# Patient Record
Sex: Male | Born: 1970 | Race: Black or African American | Hispanic: No | Marital: Married | Smoking: Current some day smoker
Health system: Southern US, Community
[De-identification: ages and names within clinical notes are randomized; demographics above are authoritative.]

## PROBLEM LIST (undated history)

## (undated) DIAGNOSIS — K859 Acute pancreatitis without necrosis or infection, unspecified: Secondary | ICD-10-CM

## (undated) DIAGNOSIS — I10 Essential (primary) hypertension: Secondary | ICD-10-CM

---

## 2001-07-31 ENCOUNTER — Emergency Department (HOSPITAL_COMMUNITY): Admission: EM | Admit: 2001-07-31 | Discharge: 2001-07-31 | Payer: Self-pay | Admitting: *Deleted

## 2010-02-23 ENCOUNTER — Emergency Department: Payer: Self-pay | Admitting: Emergency Medicine

## 2010-09-04 ENCOUNTER — Emergency Department: Payer: Self-pay | Admitting: Emergency Medicine

## 2010-09-08 ENCOUNTER — Emergency Department: Payer: Self-pay | Admitting: Emergency Medicine

## 2013-05-21 ENCOUNTER — Inpatient Hospital Stay: Payer: Self-pay | Admitting: Internal Medicine

## 2013-05-21 LAB — TROPONIN I: Troponin-I: 0.03 ng/mL

## 2013-05-21 LAB — CBC
MCV: 98 fL (ref 80–100)
RBC: 4.3 10*6/uL — ABNORMAL LOW (ref 4.40–5.90)
RDW: 14.4 % (ref 11.5–14.5)
WBC: 6.5 10*3/uL (ref 3.8–10.6)

## 2013-05-21 LAB — URINALYSIS, COMPLETE
Bacteria: NONE SEEN
Bilirubin,UR: NEGATIVE
Glucose,UR: NEGATIVE mg/dL (ref 0–75)
Ketone: NEGATIVE
Leukocyte Esterase: NEGATIVE
Protein: NEGATIVE
RBC,UR: 1 /HPF (ref 0–5)
Specific Gravity: 1.008 (ref 1.003–1.030)
WBC UR: 1 /HPF (ref 0–5)

## 2013-05-21 LAB — PRO B NATRIURETIC PEPTIDE: B-Type Natriuretic Peptide: 229 pg/mL — ABNORMAL HIGH (ref 0–125)

## 2013-05-21 LAB — COMPREHENSIVE METABOLIC PANEL
Albumin: 3.9 g/dL (ref 3.4–5.0)
Alkaline Phosphatase: 180 U/L — ABNORMAL HIGH (ref 50–136)
Bilirubin,Total: 0.9 mg/dL (ref 0.2–1.0)
Calcium, Total: 8.5 mg/dL (ref 8.5–10.1)
Co2: 37 mmol/L — ABNORMAL HIGH (ref 21–32)
Creatinine: 1.08 mg/dL (ref 0.60–1.30)
Glucose: 89 mg/dL (ref 65–99)
SGPT (ALT): 284 U/L — ABNORMAL HIGH (ref 12–78)
Sodium: 138 mmol/L (ref 136–145)

## 2013-05-21 LAB — TSH: Thyroid Stimulating Horm: 0.126 u[IU]/mL — ABNORMAL LOW

## 2013-05-21 LAB — MAGNESIUM
Magnesium: 1.3 mg/dL — ABNORMAL LOW
Magnesium: 1.2 mg/dL — ABNORMAL LOW

## 2013-05-22 LAB — COMPREHENSIVE METABOLIC PANEL
Alkaline Phosphatase: 145 U/L — ABNORMAL HIGH (ref 50–136)
Anion Gap: 2 — ABNORMAL LOW (ref 7–16)
BUN: 5 mg/dL — ABNORMAL LOW (ref 7–18)
Chloride: 103 mmol/L (ref 98–107)
Co2: 37 mmol/L — ABNORMAL HIGH (ref 21–32)
Creatinine: 1.16 mg/dL (ref 0.60–1.30)
EGFR (Non-African Amer.): >60
Osmolality: 282 (ref 275–301)
Potassium: 2.9 mmol/L — ABNORMAL LOW (ref 3.5–5.1)
SGOT(AST): 76 U/L — ABNORMAL HIGH (ref 15–37)
Sodium: 142 mmol/L (ref 136–145)

## 2013-05-22 LAB — CBC WITH DIFFERENTIAL/PLATELET
Basophil #: 0 10*3/uL (ref 0.0–0.1)
Eosinophil #: 0.2 10*3/uL (ref 0.0–0.7)
Eosinophil %: 2 %
HCT: 40.9 % (ref 40.0–52.0)
HGB: 14.3 g/dL (ref 13.0–18.0)
Lymphocyte %: 11.2 %
MCH: 34.5 pg — ABNORMAL HIGH (ref 26.0–34.0)
MCHC: 34.9 g/dL (ref 32.0–36.0)
MCV: 99 fL (ref 80–100)
Monocyte #: 0.7 x10 3/mm (ref 0.2–1.0)
Monocyte %: 7.9 %
Neutrophil %: 78.5 %
Platelet: 175 10*3/uL (ref 150–440)

## 2013-05-22 LAB — CREATININE CLEARANCE, URINE, 24 HOUR
Collection Hours: 24 hours
Creatinine Clearance: 159 mL/min — ABNORMAL HIGH (ref 80–130)
Creatinine, Serum: 1.16 mg/dL (ref 0.50–1.20)

## 2013-05-22 LAB — POTASSIUM, URINE, 24 HOUR
Collection Hours: 24 hours
Potassium, 24 Hr Urine: 23 mmol/24HR — ABNORMAL LOW (ref 25–125)

## 2013-05-22 LAB — LIPID PANEL
Cholesterol: 156 mg/dL (ref 0–200)
Ldl Cholesterol, Calc: 83 mg/dL (ref 0–100)
Triglycerides: 103 mg/dL (ref 0–200)

## 2013-05-23 LAB — BASIC METABOLIC PANEL
Anion Gap: 3 — ABNORMAL LOW (ref 7–16)
BUN: 8 mg/dL (ref 7–18)
Creatinine: 0.95 mg/dL (ref 0.60–1.30)
EGFR (Non-African Amer.): >60
Glucose: 92 mg/dL (ref 65–99)
Sodium: 139 mmol/L (ref 136–145)

## 2013-05-23 LAB — SEDIMENTATION RATE: Erythrocyte Sed Rate: 11 mm/hr (ref 0–15)

## 2013-05-24 LAB — COMPREHENSIVE METABOLIC PANEL
Albumin: 3.7 g/dL (ref 3.4–5.0)
Alkaline Phosphatase: 178 U/L — ABNORMAL HIGH (ref 50–136)
Anion Gap: 4 — ABNORMAL LOW (ref 7–16)
BUN: 10 mg/dL (ref 7–18)
Bilirubin,Total: 0.8 mg/dL (ref 0.2–1.0)
Calcium, Total: 8.9 mg/dL (ref 8.5–10.1)
Chloride: 102 mmol/L (ref 98–107)
Co2: 30 mmol/L (ref 21–32)
EGFR (African American): >60
EGFR (Non-African Amer.): >60
Osmolality: 271 (ref 275–301)
Potassium: 4.1 mmol/L (ref 3.5–5.1)
SGOT(AST): 105 U/L — ABNORMAL HIGH (ref 15–37)
SGPT (ALT): 194 U/L — ABNORMAL HIGH (ref 12–78)
Sodium: 136 mmol/L (ref 136–145)
Total Protein: 7.8 g/dL (ref 6.4–8.2)

## 2013-05-24 LAB — MAGNESIUM: Magnesium: 1.7 mg/dL — ABNORMAL LOW

## 2013-05-24 LAB — PHOSPHORUS: Phosphorus: 2.7 mg/dL (ref 2.5–4.9)

## 2013-05-25 LAB — HEPATIC FUNCTION PANEL A (ARMC)
Albumin: 3.6 g/dL (ref 3.4–5.0)
Alkaline Phosphatase: 154 U/L — ABNORMAL HIGH (ref 50–136)
Bilirubin, Direct: 0.1 mg/dL (ref 0.00–0.20)
SGOT(AST): 45 U/L — ABNORMAL HIGH (ref 15–37)

## 2013-05-25 LAB — POTASSIUM: Potassium: 4.5 mmol/L (ref 3.5–5.1)

## 2013-05-25 LAB — MAGNESIUM: Magnesium: 2.1 mg/dL

## 2013-05-26 LAB — PATHOLOGY REPORT

## 2014-08-24 IMAGING — CT CT ABDOMEN W/ CM
2 of 4 series · 14 of 32 positions shown, 19 images · IV contrast (agent unspecified)
Comparison: none

REASON FOR EXAM: need Triphasic CT with iv contrast for possible
hemangioma of liver
COMMENTS:

[Series 2: without · axial · non-contrast · 0.79mm/px · z∈[+482,+770]mm · 8 of 124 slices shown, 13 images]
[im 14/124  soft-tissue]
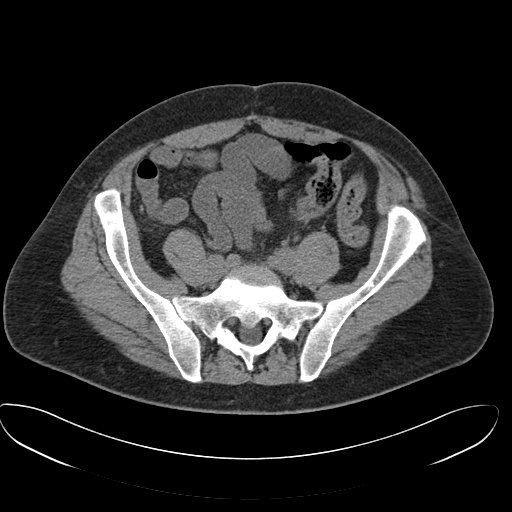
[im 14/124  bone]
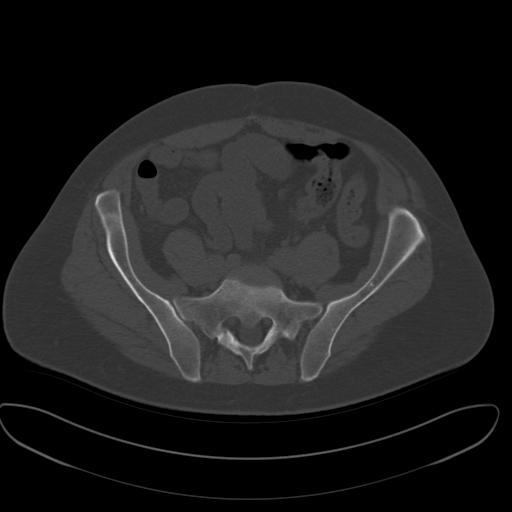
[im 28/124  soft-tissue]
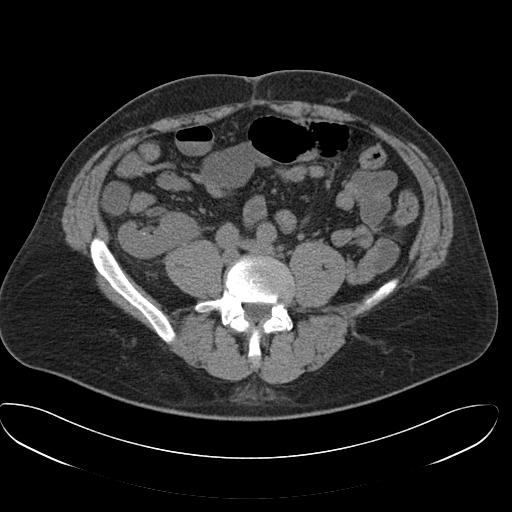
[im 42/124  soft-tissue]
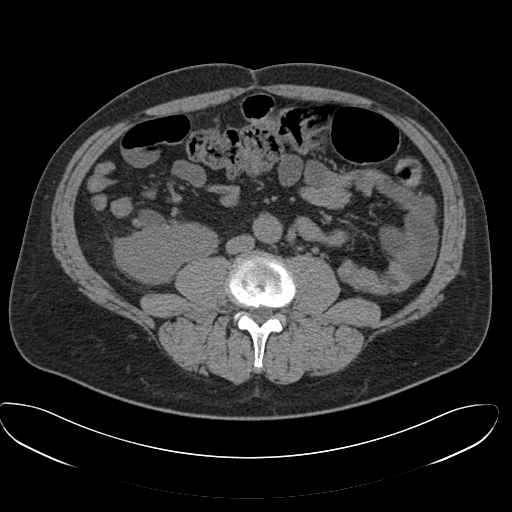
[im 55/124  soft-tissue]
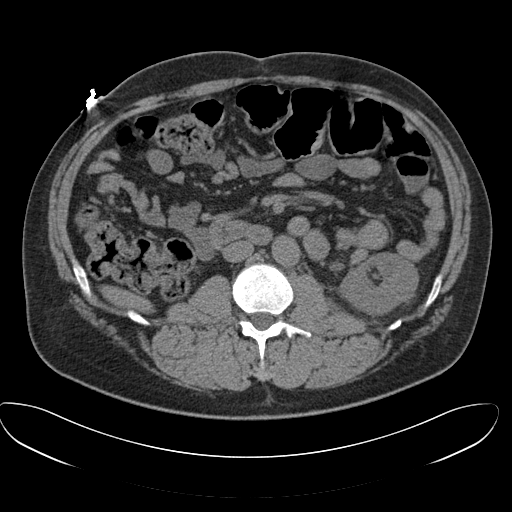
[im 69/124  soft-tissue]
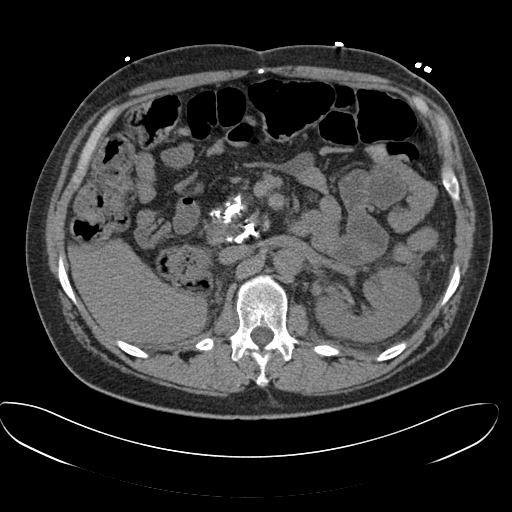
[im 69/124  lung]
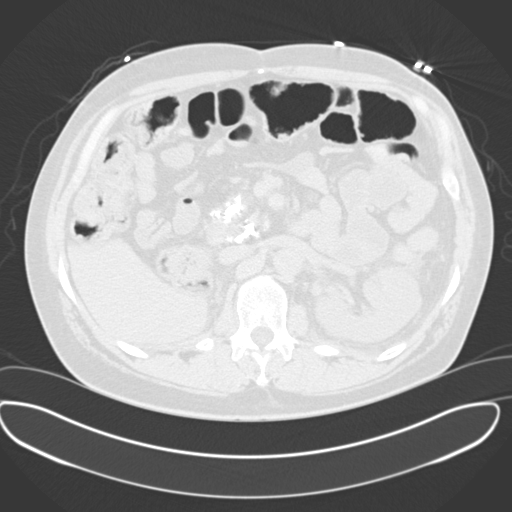
[im 83/124  soft-tissue]
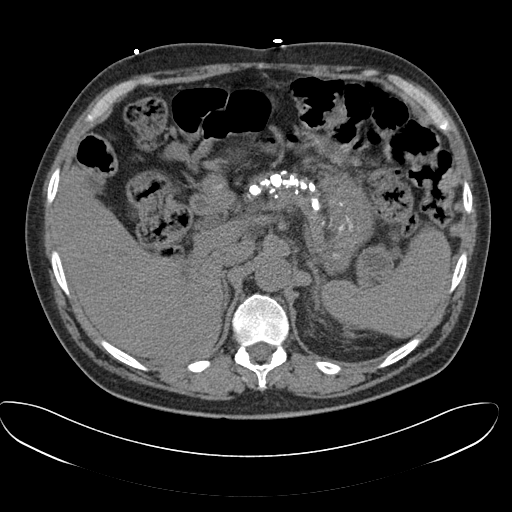
[im 83/124  lung]
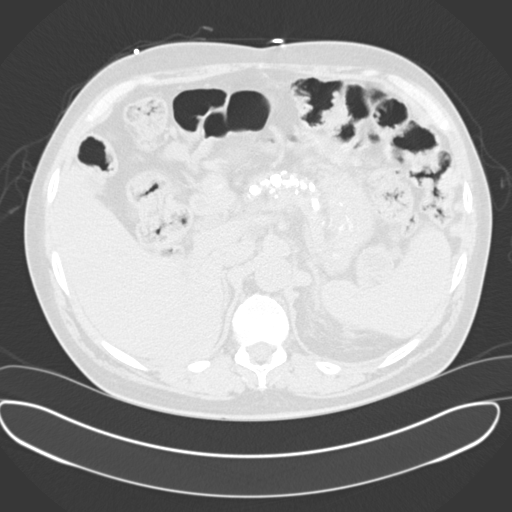
[im 96/124  soft-tissue]
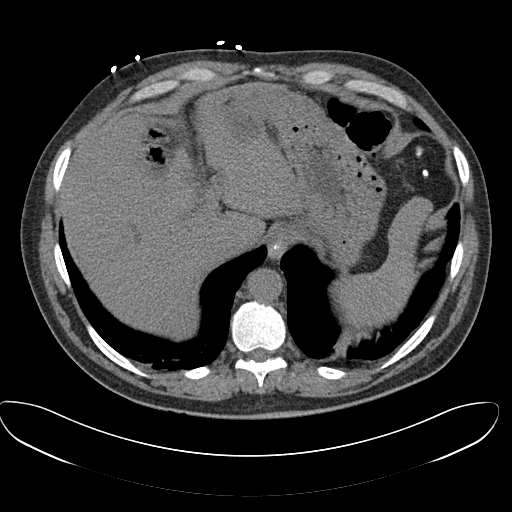
[im 96/124  lung]
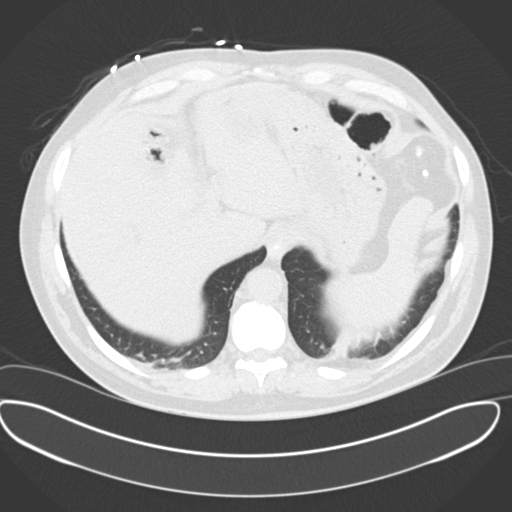
[im 110/124  soft-tissue]
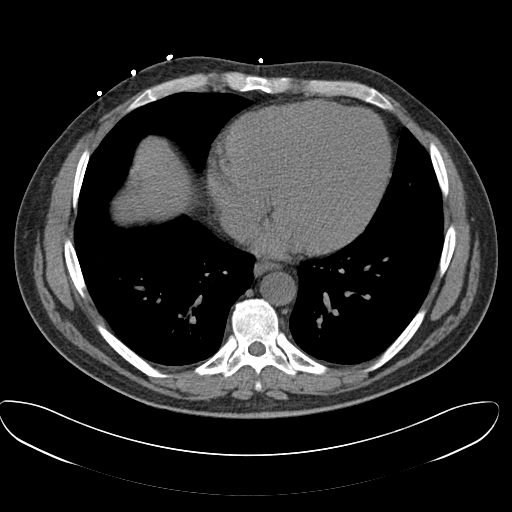
[im 110/124  lung]
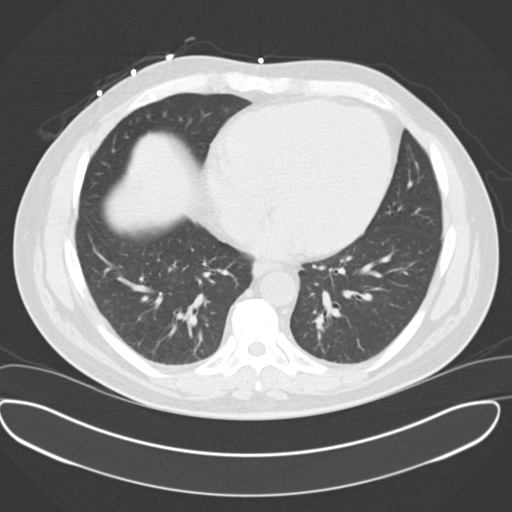

[Series 5: with · axial · 0.79mm/px · z∈[+488,+719]mm · 6 of 124 slices shown]
[im 16/124  soft-tissue]
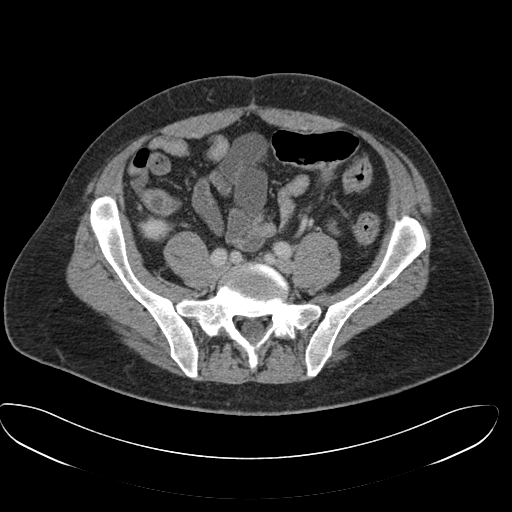
[im 31/124  soft-tissue]
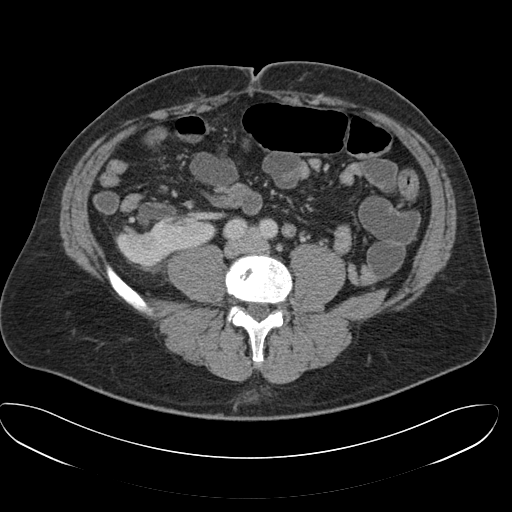
[im 47/124  soft-tissue]
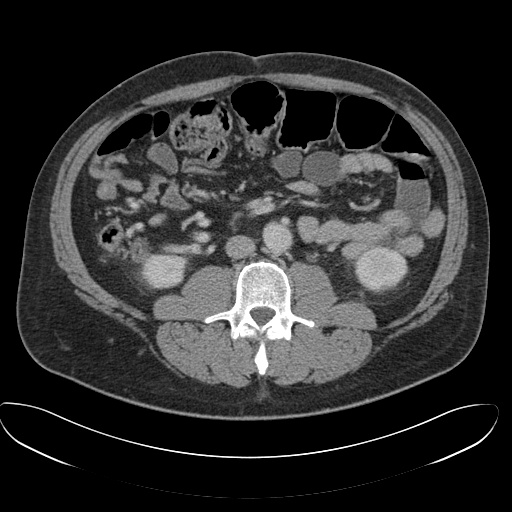
[im 62/124  soft-tissue]
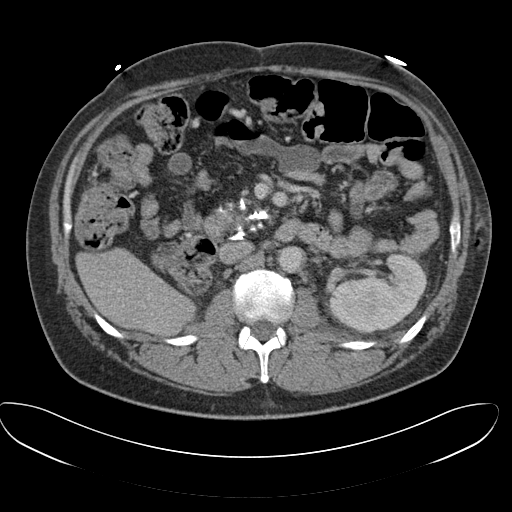
[im 77/124  soft-tissue]
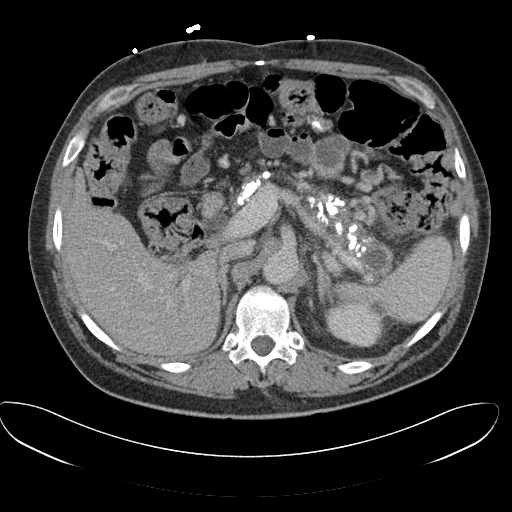
[im 93/124  soft-tissue]
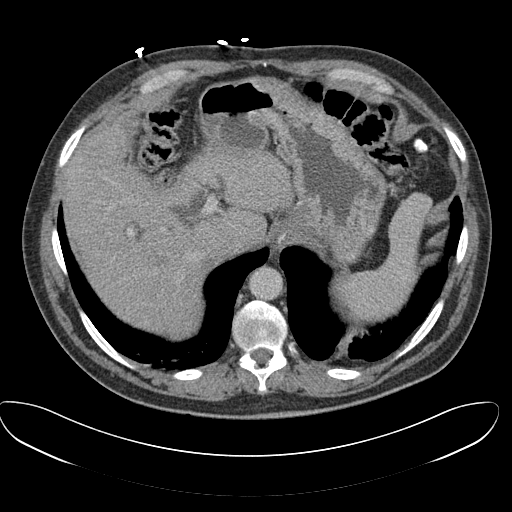

[14 of 32 positions shown; findings below may reference images not displayed]

PROCEDURE:     CT  - CT ABDOMEN STANDARD W  - May 23, 2013  [DATE]

RESULT:     CT of the abdomen is performed utilizing a multiphasic technique
with 100 mL of Tsovue-A4V iodinated intravenous contrast administered. There
is a low-attenuation area seen on the postcontrast images in the area of
image 30 measuring approximately 2.5 cm. This is poorly seen on the
noncontrast images and is not evident on the delayed postcontrast images.
There is mild intrahepatic biliary ductal dilation. Common bile duct is
prominent. The pancreas is abnormal consistent with chronic pancreatitis.
There is a left renal cyst anteriorly in the midpole region. The right
kidney is slightly rotated toward the lateral aspect. The wall the sigmoid
colon is slightly thickened. This is incompletely included. Correlate for
sigmoid colitis. The spleen, stomach, adrenal glands and abdominal aorta
appear to be unremarkable. The bony structures appear within normal limits.
The lung bases are clear.
IMPRESSION: 1. Right hepatic lobe hemangioma.
2. Chronic pancreatitis with extensive calcification.
3. Left renal cyst.
4. Incidentally noted is a portion of the sigmoid which appears to show
thickening of the wall relatively diffusely. Correlate for colitis symptoms.

[REDACTED]

## 2014-11-12 NOTE — Discharge Summary (Signed)
PATIENT NAMEDAVIDJAMES, [name] MR#:  [id number] DATE OF BIRTH:  [birth year]  DATE OF ADMISSION:  05/21/2013 DATE OF DISCHARGE:  05/25/2013  PRIMARY CARE PHYSICIAN:  [facility].   GASTROENTEROLOGIST: Dr. Mechele Collin   FINAL DIAGNOSES: 1.  Hypokalemia.  2.  Hypomagnesemia.  3.  Cervical radiculopathy with numbness in the arms.  4.  Accelerated hypertension.  5.  Abnormal liver function tests.  6.  Duodenal bulb erosion and poor appetite.   MEDICATIONS ON DISCHARGE: Include lisinopril 20 mg at bedtime, metoprolol 25 mg twice a day, magnesium oxide 400 mg daily, omeprazole 20 mg twice a day, prednisone 5 mg 4 tablets day one, 3 tablets day two, 2 tablets day three and four, 1 tablet day five and six, tramadol 50 mg twice a day as needed for pain, small prescription for this given.   FOLLOW-UP:  Dr. Mechele Collin in three weeks, 1 to 2 weeks at the [facility].   HOSPITAL COURSE: The patient was admitted 05/21/2013, discharged 05/25/2013. He came in with weakness, generalized pain in the upper extremities.   HISTORY OF PRESENT ILLNESS: A 44 year old man with hypertension and recently released from prison. He came in with vomiting, found to have a low potassium level, so he was admitted with intractable nausea and vomiting, hyponatremia, hypokalemia, severe pain and cramping and also accelerated hypertension.   LABORATORY AND RADIOLOGICAL DATA DURING THE HOSPITAL COURSE: Included an EKG that showed a sinus bradycardia, left ventricular hypertrophy, nonspecific ST-T wave changes, hepatitis A  antibody total was positive, IgM negative, hepatitis B core antibody negative, surface antigen negative, hepatitis B surface antibody reactive, hepatitis C negative. Lipase was 20, TSH 0.126. CPK 206. Magnesium 1.3. Troponin negative. Glucose 89, BUN 7, creatinine 1.08, sodium 138, potassium 2.4, chloride 98, CO2 of 37, calcium 8.5. Liver function tests: Total bilirubin 0.9, alkaline phosphatase 180, ALT 284,  AST 136, total protein 7.8, albumin 3.9. White blood cell count 6.5, hemoglobin and hematocrit 14.9 and 42.2, platelet count 169.   EKG: Repeat showed normal sinus rhythm, incomplete right bundle branch block.   T3 by RIA 95, which is in the normal range. Repeat magnesium 1.2. Repeat potassium 2.6.   Chest x-ray: No acute cardiopulmonary disease.   An HIV 1 and 2 result is still pending and that will need to be followed up as outpatient.   An ANA comprehensive panel came back negative. Serum cortisol RIA 34.7.   Renin and aldosterone: Renin plasma less than 0.15.   Urinalysis: Negative.   LDL 83, HDL 52, triglycerides 103.   On October 31, magnesium up to 2.0, potassium still down at 2.9.   Ultrasound of the abdomen showed hemangioma right lobe of the liver.   Ceruloplasmin 25.4, C-reactive protein 2.4, sedimentation rate 11, aldolase 14.7, alpha I antitrypsin 96, free thyroxine 1.17.   CT abdomen with contrast showed right hepatic lobe hemangioma, chronic pancreatitis with extensive calcification left renal cyst.   MRI cervical spine without contrast showed disk herniation, C5, C6 posterolaterally to the left causing some left-sided foraminal narrowing at the level of the herniation, correlate clinically.   On November 3,  magnesium 2.1, potassium 4.5. Liver function tests showed a total bilirubin of 0.8, alkaline phosphatase 154, ALT 142, AST 45, total protein 7.3, albumin 3.6.   HOSPITAL COURSE PER PROBLEM LIST:  1.  For the patient's hypokalemia and hypomagnesemia: Likely secondary to the patient's nausea and vomiting, unable to keep any food down. It was difficult to replace these electrolytes,  but once the patient is back on diet, we were able to replace the electrolytes and keep them up. I will not give potassium upon discharge secondary to the patient being on lisinopril. I will give low-dose magnesium on a daily basis as outpatient.  2.  For the patient's cervical  radiculopathy, I did an MRI of the cervical spine. Once I got the results back on the herniated disk, I did start the patient on IV Solu-Medrol. I will do a quick prednisone taper. His numbness in his arms had improved. The numbness could also be secondary to the electrolyte abnormalities, but I will treat the cervical radiculopathy.  3.  For the patient's accelerated hypertension, initially the patient was on Norvasc and metoprolol. I did have to stop the Norvasc secondary to cost reasons and put the patient on metoprolol and I increased the lisinopril.  4.  Abnormal liver function tests. He will follow up as an outpatient for repeat liver function tests. These are trending better. He did have hepatitis A that was positive, but not IgM, so I am not clear if this had any relation with increased liver function tests. Worthwhile repeating as an outpatient if liver function tests worsens or if elevated can consider liver biopsy as outpatient.  5.  Duodenal bulb erosion and poor appetite. The patient was placed on a proton pump inhibitor. I will put the patient on omeprazole 1 tablet twice a day 20 mg secondary to cost reasons. I advised no aspirin, Advil, Motrin or Aleve. Of note, I do recommend checking a complete metabolic panel and magnesium in follow-up appointment. Follow-up with Dr. Mechele CollinElliott in 3 weeks, 1 to 2 weeks, [facility].   TIME SPENT ON DISCHARGE: 40 minutes   ____________________________ Herschell Dimesichard J. Renae GlossWieting, MD rjw:cc D: 05/25/2013 15:56:00 ET T: 05/25/2013 18:11:34 ET JOB#: [id number]  cc: [facility] Scot Junobert T. Elliott, MD Herschell Dimesichard J. Renae GlossWieting, MD, <Dictator>   Salley ScarletICHARD J Akylah Hascall MD ELECTRONICALLY SIGNED 05/28/2013 13:32

## 2014-11-12 NOTE — Consult Note (Signed)
Details:   - EGD done today for n/v. Symptoms nearly resolved.   Findings:  Erosion in duodenal bulb.  Medium sized hiatal hernia.  Biopsies taken to evalute for h. pylori.    Recommendations: - Start protonix 40 mg BID for 30 days, then 40 mg daily - Treat h.pylori with triple therapy if positive and confirm eradication.  - No need for further inpatient management from GI standpoint.  - Will need GI clinic f/u in 4 weeks regarding abnormal liver enzymes. (reqeusted).   Electronic Signatures: Dow Adolphein, Ronie Fleeger (MD)  (Signed [phone]-Nov-14 12:11)  Authored: Details   Last Updated: 03-Nov-14 12:11 by Dow Adolphein, Shakira Los (MD)

## 2014-11-12 NOTE — Consult Note (Signed)
Pt CC abnormal US of liver.  CT with contrast showed spot to be a hemangioma.  Also seen were pancreatic calcifications from chronic pancreatitis ( hereditary),  Sed rate 11, K 2.8, BP up a bit. CT also showed slight thickening of sigmoid colon, but pt has no LLQ pain or diarrhea.  Liver panel to be repeated tomorrow.  Electronic Signatures: Scot JunElliott, Robert T (MD)  (Signed on 01-Nov-14 13:04)  Authored  Last Updated: 01-Nov-14 13:04 by Scot JunElliott, Robert T (MD)

## 2014-11-12 NOTE — H&P (Signed)
PATIENT NAMShirlyn Dickson:  [name], [name] MR#:  [id number] DATE OF BIRTH:  04/04/1971  DATE OF ADMISSION:  05/21/2013  PRIMARY CARE PHYSICIAN:  None.   REFERRING PHYSICIAN:  Daryel NovemberJonathan Williams, MD.   CHIEF COMPLAINT:  Weakness, generalized, and pain on the upper extremities.   HISTORY OF PRESENT ILLNESS:  This is a 44 year old gentleman with a history of hypertension, recently released from prison two weeks ago. While he was in prison he was getting treatment for his hypertension, but after he was discharged, he did not have any follow up with any primary care physician. The patient states that for the past 2 to 3 days, he is having significant vomiting, multiple episodes over today. At this moment, he is very agitated and he crying from pain on his arms and he is not able to provide very good information, but he is telling me that he does not know how many times he has vomited, but it has been many times, has not been able to eat or drink much so he has intractable nausea and vomiting. The patient was found to have a significant decrease in potassium levels at 2.4. He is getting some potassium p.o. and has not had any relief of the symptoms. He had changes on his EKG with ST flattening with T-wave inversions  lower leads, which correlates with his low potassium and a prolonged QT at 442/480. The patient is going to be admitted for treatment of this process as well as investigation of the primary cause of it.   REVIEW OF SYSTEMS:  Unable to obtain a full review of systems as the patient is crying in pain, not being cooperative due to that. But with the family, they can tell me that he has not had a fever but he has been very fatigued and very weak. He has not had any significant double vision, blurry vision. Denies any tinnitus or difficulty swallowing.  RESPIRATORY:  No cough. No hemoptysis.  CARDIOVASCULAR:  No chest pain. No orthopnea, no syncope.  GASTROINTESTINAL:  Positive nausea. Positive vomiting. No  diarrhea. No other family members at home with this problem. No food of unknown precedence.  GENITOURINARY:  No dysuria, hematuria or changes in frequency.  ENDOCRINE:  No polyuria, polydipsia, polyphagia, cold or heat intolerance. Actually, the patient states he has been urinating a lot, but that is not a change in frequency. He has been like that for a while.  HEMATOLOGY:  No bleeding or easy bruising.   MUSCULOSKELETAL:  Positive cramping of upper extremities, weakness of generalized muscle mass.  NEUROLOGIC:  No vertigo,  no CVAs. Positive tingling of upper extremities. Positive weakness. Positive cramping muscles.  PSYCHIATRIC: No significant depression or insomnia.   PAST MEDICAL HISTORY:  Positive for hypertension, positive multiple episodes of pancreatitis. The patient is a drinker. 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  ALLERGIES:  No known drug allergies.   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   FAMILY HISTORY:  Positive for multiple members with hypertension. Positive MI of maternal grandmother. No CVAs. Negative for cancer.   SOCIAL HISTORY:  Apparently the patient has never smoked. He does not drink. He has been released from prison for the past couple of weeks.   MEDICATIONS:  He is not taking any medications.   PHYSICAL EXAMINATION:  VITAL SIGNS:  Blood pressure 190/114, temperature 98.4, pulse 60, respirations 18, oxygen saturation 96% on room air.  GENERAL:  The patient is  alert, oriented x 3, in severe distress due to pain. The patient crying. The patient has some periorbital edema at this moment, likely from all the crying. He does not look to be fluid overload.  HEENT:  His pupils are equal and reactive, anicteric sclerae. Pink conjunctivae. No oral lesions. No jaundice. The mouth is very dry, tongue is very dry, saliva is very thick. His commissures of the mouth are broken, but not bleeding. No oral  lesions. No oropharyngeal exudates.  NECK:  Supple. No JVD. No thyromegaly. No adenopathy. No thyroid bruits. No rigidity.  CARDIOVASCULAR:  Regular rate and rhythm. No murmurs, rubs or gallops are appreciated. No displacement of PMI.  LUNGS:  Clear without any wheezing or crepitus. No use of accessory muscles.  ABDOMEN:  Soft, nontender, nondistended. No hepatosplenomegaly. No masses. Bowel sounds are positive.  GENITAL:  Deferred.  EXTREMITIES:  No edema, cyanosis or clubbing. Upper extremities had significant cramping with the patient having the hand on a swollen deformity not related to rheumatoid arthritis, of course, but it is mostly because of the cramping, very tender to palpation and movement of hands at the level of the wrist and fingers due to cramping. No edemas.  CARDIOVASCULAR:  Vascular pulses +2. Capillary refill is around 4 to 5.  NEUROLOGIC:  Cranial nerves II through XII intact. Strength 4/5 in 4 extremities, very weak.  PSYCHIATRIC:  Mood is very agitated due to pain.  MUSCULOSKELETAL:  No joint deformity, other than the cramping.   LAB RESULTS:  BNP is 229, BUN 7, glucose 89, potassium 2.4. Sodium 138, CO2 is elevated at 37, alkaline phosphatase 180. AST 136, ALT 284. Troponin is negative. White count 6.5, hemoglobin 14.   Urinalysis is pending.   EKG:  As mentioned above.   ASSESSMENT AND PLAN:  A very nice 44 year old gentleman with a history of hypertension, chronic pancreatitis, admitted with weakness, cramping of the upper extremities, severe hyponatremia with EKG changes.  1.  Intractable nausea and vomiting. This is likely the cause of his hyponatremia. No signs of primary liver infection, although he has an elevation of transaminase and he is coming back from prison for what we are going to do hepatitis testing just to make sure he does not have acute hepatitis.  2.  Good hydration as the patient is severely dehydrated.  3.  Hypokalemia. This is the main cause of his  pain and cramping. Potassium is 2.4. He has received some p.o., 60 mEq. I am going to recheck his potassium levels and try to replace them IV as the patient is having significant cramping for a faster improvement. The patient has significant pain from this for what he is going to have Dilaudid.  4.  As far as the hypokalemia goes, we are going to do a urinary potassium level, 24 hours. We are going to get aldosterone running to rule out hyperaldoseronemism as the patient is hypertensive and African American as well. He will benefit from the start of potassium-sparing diuretic if that is the case, but we are not going to start it until we complete the testing with a 24-hour urine, so we do not altered the results.  5.  Get cortisol level. We are going to check TSH as well.  6.  Check magnesium as if it is depleted, he will have problems getting his potassium high back again.  7.  Severe pain secondary to cramping. We will give him Dilaudid for 24 hours then stopped it as the cramping  should be resolved soon after his potassium is resolved.  8.  Elevated liver function tests, rule out possibility of hepatitis, acute versus chronic. Check HIV.  9.  If hepatitis profile is negative, consider secondary workup for hereditary liver disease including alpha antitrypsin mitochondrial testing.  10.  The patient is not a drinker for what not likely this is related to alcohol. Rule out pancreatitis as well. Check lipase. 11.  Metabolic alkalosis, unknown source. Recheck CO2 in the morning. Likely this is temporary, we are going to recheck closely.  12.  Hypertension. Accelerated hypertension with diastolics above 110. Give him IV hydralazine, possible IV Vasotec, hold the beta blockers as the patient has heart rate in the 50s and 60s.  13.  Evaluate for secondary causes of hypertension, like hyperaldosteronism. Check an x-ray to evaluate mediastinum. If there are any changes in his mediastinum, get an echocardiogram or  possibly a CT.   CODE STATUS:  The patient is a FULL CODE.   TIME SPENT:  I spent about 45 minutes with this patient.  ____________________________ Felipa Furnace, MD rsg:jm D: 05/21/2013 14:29:45 ET T: 05/21/2013 15:01:38 ET JOB#: [id number]  cc: Felipa Furnace, MD, <Dictator> Michaiah Holsopple Juanda Chance MD ELECTRONICALLY SIGNED 06/10/2013 22:55

## 2014-11-12 NOTE — Consult Note (Signed)
CC: elevated LFT's. hemangioma on US, elevated cortisol level, pain in hands bilat.  Recommend evaluate for Cushings disease, check CRP and sed rate, consider rheumatology consult, Alpha 1 antitrypsin level, ceruloplasmin level, aldolase, urine drug screen, triphasic CT for evaluate probable hemangioma or MRI with contrast.  Electronic Signatures: Scot JunElliott, Robert T (MD)  (Signed on 31-Oct-14 19:09)  Authored  Last Updated: 31-Oct-14 19:09 by Scot JunElliott, Robert T (MD)

## 2014-11-12 NOTE — Discharge Summary (Signed)
PATIENT NAMShirlyn Dickson:  Melamed, Kastiel MR#:  [id number] DATE OF BIRTH:  08-Jun-1971  DATE OF ADMISSION:  05/21/2013 DATE OF DISCHARGE:  05/26/2013  ADDENDUM:  The patient's primary care physician will be at the [facility].   The patient did not go home on November 3rd because he felt nauseous and did not eat until about 12 midnight so they did not send him home. The patient was able to eat lunch. Dr. Mechele CollinElliott agreed he is able to go home. The patient was not having any abdominal pain.  Blood pressure has been variable during the hospital course. Secondary to cost reasons unable to give Norvasc upon discharge. Will add clonidine 0.1 mg b.i.d. to his current medications. Blood pressure upon going home 173/86. Please see discharge summary dictated yesterday for hospital course up until that point.   ____________________________ Herschell Dimesichard J. Renae GlossWieting, MD rjw:cs D: 05/26/2013 17:36:44 ET T: 05/26/2013 18:40:29 ET JOB#: [id number]  cc: Herschell Dimesichard J. Renae GlossWieting, MD, <Dictator> Salley ScarletICHARD J Kermit Arnette MD ELECTRONICALLY SIGNED 05/28/2013 13:32

## 2014-11-12 NOTE — Consult Note (Signed)
CC: nausea, abd pain, vomiting, he had duodenal erosion on EGD.  Ate all his lunch.  Can go home and follow up with us as out pt with repeat LFT's.  Should go home on bid PPI if possible.  See him in 2-3 weeks.  Electronic Signatures: Scot JunElliott, Robert T (MD)  (Signed on 04-Nov-14 12:59)  Authored  Last Updated: 04-Nov-14 12:59 by Scot JunElliott, Robert T (MD)

## 2014-11-12 NOTE — Consult Note (Signed)
PATIENT NAME:  [name], [name] MR#:  [id number] DATE OF BIRTH:  1970-10-27  DATE OF CONSULTATION:  05/22/2013  CONSULTING PHYSICIAN:  Manya Silvas, MD  The patient is a 44 year old black male, recently released from prison 2 weeks ago, who was admitted with unusual symptoms. I was asked to see him in consultation. He began having vomiting about 3 to 4 days ago. His male partner or wife ate the same thing and did not get sick. He denies any exposure to anyone with the flu, but he had vomiting once or twice a day starting Tuesday, Wednesday and Thursday. He developed pain in his arms and pain in his hands, but bilaterally. He was found to have significant decrease in potassium and magnesium and had some flattening of T waves. Because of the electrolyte abnormalities and the vomiting and nausea, he was admitted to the hospital.   The patient was found on ultrasound to have a hemangioma, 1.94 x 1.95 cm. He was noted to have some mildly elevated transaminases and alk phos. Alk phos was 188, AST 136, ALT 284. I was asked to see him in consultation.   The patient is in significantly less pain than he was on admission. He currently denies any shortness of breath. No coughing. No chest pains. He denies any illicit drug use. No strokes. He does have positive tingling of upper extremities. There has no been no hematemesis. No rectal bleeding.   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 He also has a history of hypertension. Blood pressure was very elevated when he came to the ER.   HABITS: Nonsmoker, nondrinker. Was released from prison a couple of weeks ago after 2-1/2 years.   PHYSICAL EXAMINATION: GENERAL: Black male in no acute distress.  HEENT: Sclerae anicteric. Conjunctivae negative.  HEAD: Atraumatic.  CHEST: Clear.  HEART: Shows no  murmurs, gallops, clicks or rubs.  ABDOMEN: Old scar is present. No hepatosplenomegaly. No masses. No bruits. No significant tenderness.  SKIN: Warm and dry.  PSYCHIATRIC: Mood and affect are appropriate, alert.   LABORATORY DATA: Magnesium on admission was 1.3. He has had magnesium that is up to 2 now. His potassium has been corrected as well. He had other labs done because of his initial abnormalities. Cortisol a.m. level was 34.7 with normal range being 6.2 to 19. Hepatitis A, B and C were negative for any acute disease. ANA was negative. TSH was low, but T3 was normal. CPK was normal.   ASSESSMENT: 1. Hemangioma. There are 2 ways to approach this small hemangioma. One is to repeat ultrasound in 6 months. If there has been no change, no further evaluation needed because most of these are stable. The second way to do it is to go ahead and get a triphasic CT or an MRI with contrast. These can clarify with a relatively high degree of certainty that this is a hemangioma and not any other abnormality. Because of his elevated cortisol level, consideration for evaluation for Cushing disease could be done. I would recommend a sed rate and a CRP. Recommend a urine drug screen. Recommend alpha-1 antitrypsin because of his elevated liver functions. Even though his CPK was negative, I would check an aldolase level, which would go along with muscle inflammation. Consideration for endocrine consult and rheumatological consult could be made.  2.  The problem with his hands  is a bit unusual, especially both hands and has some tingling. This could have been due to low potassium and magnesium. Consideration could be given for getting a C-spine MRI for possible nerve impingement.    ____________________________ Manya Silvas, MD rte:dmm D: 05/22/2013 19:17:00 ET T: 05/22/2013 19:53:12 ET JOB#: [id number]  cc: Manya Silvas, MD, <Dictator> Dr. Colan Neptune James Ivanoff, MD Manya Silvas  MD ELECTRONICALLY SIGNED 06/23/2013 15:02

## 2019-07-02 ENCOUNTER — Emergency Department: Payer: Medicaid Other

## 2019-07-02 ENCOUNTER — Other Ambulatory Visit: Payer: Self-pay

## 2019-07-02 ENCOUNTER — Emergency Department
Admission: EM | Admit: 2019-07-02 | Discharge: 2019-07-02 | Disposition: A | Discharge status: Home / Self Care | Payer: Medicaid Other | Attending: Emergency Medicine | Admitting: Emergency Medicine

## 2019-07-02 ENCOUNTER — Encounter: Payer: Self-pay | Admitting: Intensive Care

## 2019-07-02 DIAGNOSIS — L03115 Cellulitis of right lower limb: Secondary | ICD-10-CM | POA: Insufficient documentation

## 2019-07-02 DIAGNOSIS — I10 Essential (primary) hypertension: Secondary | ICD-10-CM | POA: Insufficient documentation

## 2019-07-02 DIAGNOSIS — F17210 Nicotine dependence, cigarettes, uncomplicated: Secondary | ICD-10-CM | POA: Insufficient documentation

## 2019-07-02 HISTORY — DX: Acute pancreatitis without necrosis or infection, unspecified: K85.90

## 2019-07-02 HISTORY — DX: Essential (primary) hypertension: I10

## 2019-07-02 LAB — CBC WITH DIFFERENTIAL/PLATELET
Abs Immature Granulocytes: 0.03 10*3/uL (ref 0.00–0.07)
Basophils Absolute: 0 10*3/uL (ref 0.0–0.1)
Basophils Relative: 0 %
Eosinophils Absolute: 0.1 10*3/uL (ref 0.0–0.5)
Eosinophils Relative: 2 %
HCT: 33.3 % — ABNORMAL LOW (ref 39.0–52.0)
Hemoglobin: 11.3 g/dL — ABNORMAL LOW (ref 13.0–17.0)
Immature Granulocytes: 0 %
Lymphocytes Relative: 7 %
Lymphs Abs: 0.5 10*3/uL — ABNORMAL LOW (ref 0.7–4.0)
MCH: 33.4 pg (ref 26.0–34.0)
MCHC: 33.9 g/dL (ref 30.0–36.0)
MCV: 98.5 fL (ref 80.0–100.0)
Monocytes Absolute: 0.5 10*3/uL (ref 0.1–1.0)
Monocytes Relative: 8 %
Neutro Abs: 5.6 10*3/uL (ref 1.7–7.7)
Neutrophils Relative %: 83 %
Platelets: 176 10*3/uL (ref 150–400)
RBC: 3.38 MIL/uL — ABNORMAL LOW (ref 4.22–5.81)
RDW: 12.6 % (ref 11.5–15.5)
WBC: 6.8 10*3/uL (ref 4.0–10.5)
nRBC: 0 % (ref 0.0–0.2)

## 2019-07-02 LAB — URINALYSIS, COMPLETE (UACMP) WITH MICROSCOPIC
Bacteria, UA: NONE SEEN
Bilirubin Urine: NEGATIVE
Glucose, UA: NEGATIVE mg/dL
Hgb urine dipstick: NEGATIVE
Ketones, ur: NEGATIVE mg/dL
Leukocytes,Ua: NEGATIVE
Nitrite: NEGATIVE
Protein, ur: NEGATIVE mg/dL
Specific Gravity, Urine: 1.017 (ref 1.005–1.030)
pH: 6 (ref 5.0–8.0)

## 2019-07-02 LAB — COMPREHENSIVE METABOLIC PANEL
ALT: 13 U/L (ref 0–44)
AST: 15 U/L (ref 15–41)
Albumin: 3.5 g/dL (ref 3.5–5.0)
Alkaline Phosphatase: 70 U/L (ref 38–126)
Anion gap: 7 (ref 5–15)
BUN: 14 mg/dL (ref 6–20)
CO2: 26 mmol/L (ref 22–32)
Calcium: 8.5 mg/dL — ABNORMAL LOW (ref 8.9–10.3)
Chloride: 107 mmol/L (ref 98–111)
Creatinine, Ser: 1.15 mg/dL (ref 0.61–1.24)
GFR calc Af Amer: >60 mL/min (ref >60)
GFR calc non Af Amer: >60 mL/min (ref >60)
Glucose, Bld: 113 mg/dL — ABNORMAL HIGH (ref 70–99)
Potassium: 3.1 mmol/L — ABNORMAL LOW (ref 3.5–5.1)
Sodium: 140 mmol/L (ref 135–145)
Total Bilirubin: 0.6 mg/dL (ref 0.3–1.2)
Total Protein: 6.8 g/dL (ref 6.5–8.1)

## 2019-07-02 LAB — LACTIC ACID, PLASMA
Lactic Acid, Venous: 0.7 mmol/L (ref 0.5–1.9)
Lactic Acid, Venous: 1.3 mmol/L (ref 0.5–1.9)

## 2019-07-02 MED ORDER — SULFAMETHOXAZOLE-TRIMETHOPRIM 800-160 MG PO TABS: 1.0000 | ORAL_TABLET | Freq: Two times a day (BID) | ORAL | 0 refills | Status: AC

## 2019-07-02 MED ORDER — MORPHINE SULFATE (PF) 4 MG/ML IV SOLN
4.0000 mg | Freq: Once | INTRAVENOUS | Status: AC
  Administered 2019-07-02: 4 mg via INTRAVENOUS
  Filled 2019-07-02: qty 1

## 2019-07-02 MED ORDER — OXYCODONE-ACETAMINOPHEN 5-325 MG PO TABS: 1.0000 | ORAL_TABLET | Freq: Two times a day (BID) | ORAL | 0 refills | Status: AC | PRN

## 2019-07-02 MED ORDER — VANCOMYCIN HCL IN DEXTROSE 1-5 GM/200ML-% IV SOLN
1000.0000 mg | Freq: Once | INTRAVENOUS | Status: AC
  Administered 2019-07-02: 1000 mg via INTRAVENOUS
  Filled 2019-07-02: qty 200

## 2019-07-02 MED ORDER — DEXAMETHASONE SODIUM PHOSPHATE 10 MG/ML IJ SOLN
10.0000 mg | Freq: Once | INTRAMUSCULAR | Status: AC
  Administered 2019-07-02: 10 mg via INTRAVENOUS
  Filled 2019-07-02: qty 1

## 2019-07-02 NOTE — ED Notes (Signed)
VS obtained by this RN. This RN apologized for and explained delay to patient. Pt states understanding at this time.

## 2019-07-02 NOTE — ED Notes (Signed)
Pt offered wheelchair to lobby, pt refused. Pt states that he is fine walking to lobby. Pt ambulated to lobby, gait steady.

## 2019-07-02 NOTE — ED Triage Notes (Signed)
PAtient presents with swollen right foot and redness that is spreading up his leg. Reports this began 3-4 days ago

## 2019-07-02 NOTE — ED Provider Notes (Signed)
[facility] Emergency Department Provider Note       Time seen: ----------------------------------------- 6:08 PM on 07/02/2019 -----------------------------------------   I have reviewed the triage vital signs and the nursing notes.  HISTORY   Chief Complaint Foot Swelling (right)    HPI [name] is a 48 y.o. male with a history of hypertension and pancreatitis who presents to the ED for right foot swelling and pain.  Patient states pain started 3 to 4 days ago.  He reports foot redness and swelling has been spreading up his leg, he has never had this happen before.  Pain is 9 out of 10.  Past Medical History:  Diagnosis Date  . Hypertension   . Pancreatitis     There are no problems to display for this patient.   History reviewed. No pertinent surgical history.  Allergies Patient has no known allergies.  Social History Social History   Tobacco Use  . Smoking status: Current Some Day Smoker    Types: Cigarettes  . Smokeless tobacco: Never Used  Substance Use Topics  . Alcohol use: Never  . Drug use: Never   Review of Systems Constitutional: Negative for fever. Cardiovascular: Negative for chest pain. Respiratory: Negative for shortness of breath. Gastrointestinal: Negative for abdominal pain, vomiting and diarrhea. Musculoskeletal: Positive for right foot pain Skin: Positive for right foot redness and swelling Neurological: Negative for headaches, focal weakness or numbness.  All systems negative/normal/unremarkable except as stated in the HPI  ____________________________________________   PHYSICAL EXAM:  VITAL SIGNS: ED Triage Vitals  Enc Vitals Group     BP 07/02/19 1532 (!) 158/93     Pulse Rate 07/02/19 1532 66     Resp 07/02/19 1532 16     Temp 07/02/19 1532 98.6 F (37 C)     Temp Source 07/02/19 1532 Oral     SpO2 07/02/19 1532 99 %     Weight 07/02/19 1549 185 lb (83.9 kg)     Height 07/02/19 1549 5\' 11"   (1.803 m)     Head Circumference --      Peak Flow --      Pain Score 07/02/19 1549 9     Pain Loc --      Pain Edu? --      Excl. in [place]? --     Constitutional: Alert and oriented. Well appearing and in no distress. Eyes: Conjunctivae are normal. Normal extraocular movements. Cardiovascular: Normal rate, regular rhythm. No murmurs, rubs, or gallops. Respiratory: Normal respiratory effort without tachypnea nor retractions. Breath sounds are clear and equal bilaterally. No wheezes/rales/rhonchi. Musculoskeletal: Extensive right foot redness and swelling, particular over the lateral aspect of the foot, nothing medially. Neurologic:  Normal speech and language. No gross focal neurologic deficits are appreciated.  Skin: Skin beneath all of his toes is cracked and fissured, there is significant erythema and swelling of his right foot, particularly dorsolaterally.  Area is tender to touch. Psychiatric: Mood and affect are normal. Speech and behavior are normal.  ____________________________________________  ED COURSE:  As part of my medical decision making, I reviewed the following data within the [place] History obtained from family if available, nursing notes, old chart and ekg, as well as notes from prior ED visits. Patient presented for right foot swelling and pain, we will assess with labs and imaging as indicated at this time.   Procedures  [name] was evaluated in Emergency Department on 07/02/2019 for the symptoms described in the history  of present illness. He was evaluated in the context of the global COVID-19 pandemic, which necessitated consideration that the patient might be at risk for infection with the SARS-CoV-2 virus that causes COVID-19. Institutional protocols and algorithms that pertain to the evaluation of patients at risk for COVID-19 are in a state of rapid change based on information released by regulatory bodies including the CDC and federal  and state organizations. These policies and algorithms were followed during the patient's care in the ED.  ____________________________________________   LABS (pertinent positives/negatives)  Labs Reviewed  COMPREHENSIVE METABOLIC PANEL - Abnormal; Notable for the following components:      Result Value   Potassium 3.1 (*)    Glucose, Bld 113 (*)    Calcium 8.5 (*)    All other components within normal limits  CBC WITH DIFFERENTIAL/PLATELET - Abnormal; Notable for the following components:   RBC 3.38 (*)    Hemoglobin 11.3 (*)    HCT 33.3 (*)    Lymphs Abs 0.5 (*)    All other components within normal limits  LACTIC ACID, PLASMA  LACTIC ACID, PLASMA  URINALYSIS, COMPLETE (UACMP) WITH MICROSCOPIC    RADIOLOGY  Right foot x-ray IMPRESSION: No acute osseous abnormality.  Diffuse dorsal soft tissue swelling.  ____________________________________________   DIFFERENTIAL DIAGNOSIS   Cellulitis, abscess, osteomyelitis, sepsis, gout, erythrasma  FINAL ASSESSMENT AND PLAN  Cellulitis   Plan: The patient had presented for right foot swelling and pain with erythema. Patient's labs are reassuring. Patient's imaging not reveal any acute process.  He was given IV vancomycin as well as Decadron and morphine.  He will be discharged home with Septra DS and Percocet for pain.  He is advised to follow-up in 2 days for recheck.   Ulice Dash, MD    Note: This note was generated in part or whole with voice recognition software. Voice recognition is usually quite accurate but there are transcription errors that can and very often do occur. I apologize for any typographical errors that were not detected and corrected.     Emily Filbert, MD 07/02/19 [phone]

## 2019-07-02 NOTE — ED Notes (Signed)
Pt with edematous RLE. Top of right foot red and hot. Painful to touch per pt. Pt states this has been getting worse over the last 3-4 days. Right leg elevated.

## 2020-02-27 ENCOUNTER — Emergency Department
Admission: EM | Admit: 2020-02-27 | Discharge: 2020-02-27 | Disposition: A | Discharge status: Home / Self Care | Payer: Medicaid Other | Attending: Emergency Medicine | Admitting: Emergency Medicine

## 2020-02-27 ENCOUNTER — Encounter: Payer: Self-pay | Admitting: Emergency Medicine

## 2020-02-27 ENCOUNTER — Other Ambulatory Visit: Payer: Self-pay

## 2020-02-27 DIAGNOSIS — M79671 Pain in right foot: Secondary | ICD-10-CM | POA: Insufficient documentation

## 2020-02-27 DIAGNOSIS — M79672 Pain in left foot: Secondary | ICD-10-CM | POA: Insufficient documentation

## 2020-02-27 DIAGNOSIS — E119 Type 2 diabetes mellitus without complications: Secondary | ICD-10-CM | POA: Insufficient documentation

## 2020-02-27 DIAGNOSIS — R58 Hemorrhage, not elsewhere classified: Secondary | ICD-10-CM | POA: Insufficient documentation

## 2020-02-27 DIAGNOSIS — Z5321 Procedure and treatment not carried out due to patient leaving prior to being seen by health care provider: Secondary | ICD-10-CM | POA: Insufficient documentation

## 2020-02-27 LAB — COMPREHENSIVE METABOLIC PANEL
ALT: 17 U/L (ref 0–44)
AST: 19 U/L (ref 15–41)
Albumin: 3.6 g/dL (ref 3.5–5.0)
Alkaline Phosphatase: 55 U/L (ref 38–126)
Anion gap: 11 (ref 5–15)
BUN: 21 mg/dL — ABNORMAL HIGH (ref 6–20)
CO2: 25 mmol/L (ref 22–32)
Calcium: 8.7 mg/dL — ABNORMAL LOW (ref 8.9–10.3)
Chloride: 104 mmol/L (ref 98–111)
Creatinine, Ser: 1.4 mg/dL — ABNORMAL HIGH (ref 0.61–1.24)
GFR calc Af Amer: >60 mL/min (ref >60)
GFR calc non Af Amer: 58 mL/min — ABNORMAL LOW (ref >60)
Glucose, Bld: 165 mg/dL — ABNORMAL HIGH (ref 70–99)
Potassium: 3.4 mmol/L — ABNORMAL LOW (ref 3.5–5.1)
Sodium: 140 mmol/L (ref 135–145)
Total Bilirubin: 0.5 mg/dL (ref 0.3–1.2)
Total Protein: 6.9 g/dL (ref 6.5–8.1)

## 2020-02-27 LAB — CBC WITH DIFFERENTIAL/PLATELET
Abs Immature Granulocytes: 0.02 10*3/uL (ref 0.00–0.07)
Basophils Absolute: 0 10*3/uL (ref 0.0–0.1)
Basophils Relative: 1 %
Eosinophils Absolute: 0.3 10*3/uL (ref 0.0–0.5)
Eosinophils Relative: 5 %
HCT: 34.1 % — ABNORMAL LOW (ref 39.0–52.0)
Hemoglobin: 11.1 g/dL — ABNORMAL LOW (ref 13.0–17.0)
Immature Granulocytes: 0 %
Lymphocytes Relative: 21 %
Lymphs Abs: 1.2 10*3/uL (ref 0.7–4.0)
MCH: 34.4 pg — ABNORMAL HIGH (ref 26.0–34.0)
MCHC: 32.6 g/dL (ref 30.0–36.0)
MCV: 105.6 fL — ABNORMAL HIGH (ref 80.0–100.0)
Monocytes Absolute: 0.5 10*3/uL (ref 0.1–1.0)
Monocytes Relative: 8 %
Neutro Abs: 3.9 10*3/uL (ref 1.7–7.7)
Neutrophils Relative %: 65 %
Platelets: 203 10*3/uL (ref 150–400)
RBC: 3.23 MIL/uL — ABNORMAL LOW (ref 4.22–5.81)
RDW: 13 % (ref 11.5–15.5)
WBC: 5.9 10*3/uL (ref 4.0–10.5)
nRBC: 0 % (ref 0.0–0.2)

## 2020-02-27 NOTE — ED Triage Notes (Signed)
Pt presents to ED via POV with c/o bilateral foot pain, denies hx of DM. Pt states wound to L foot that has "busted and is bleeding". Pt ambulatory with limp to triage.

## 2020-10-02 IMAGING — CR DG FOOT COMPLETE 3+V*R*
1 series · 3 of 3 positions shown · non-contrast
Comparison: None.

CLINICAL DATA: Swollen redness and warmth attach

EXAM:
RIGHT FOOT COMPLETE - 3+ VIEW

[Series 1: dg foot complete right · 0.14mm/px · 3 of 3 slices shown]
[im 1/3]
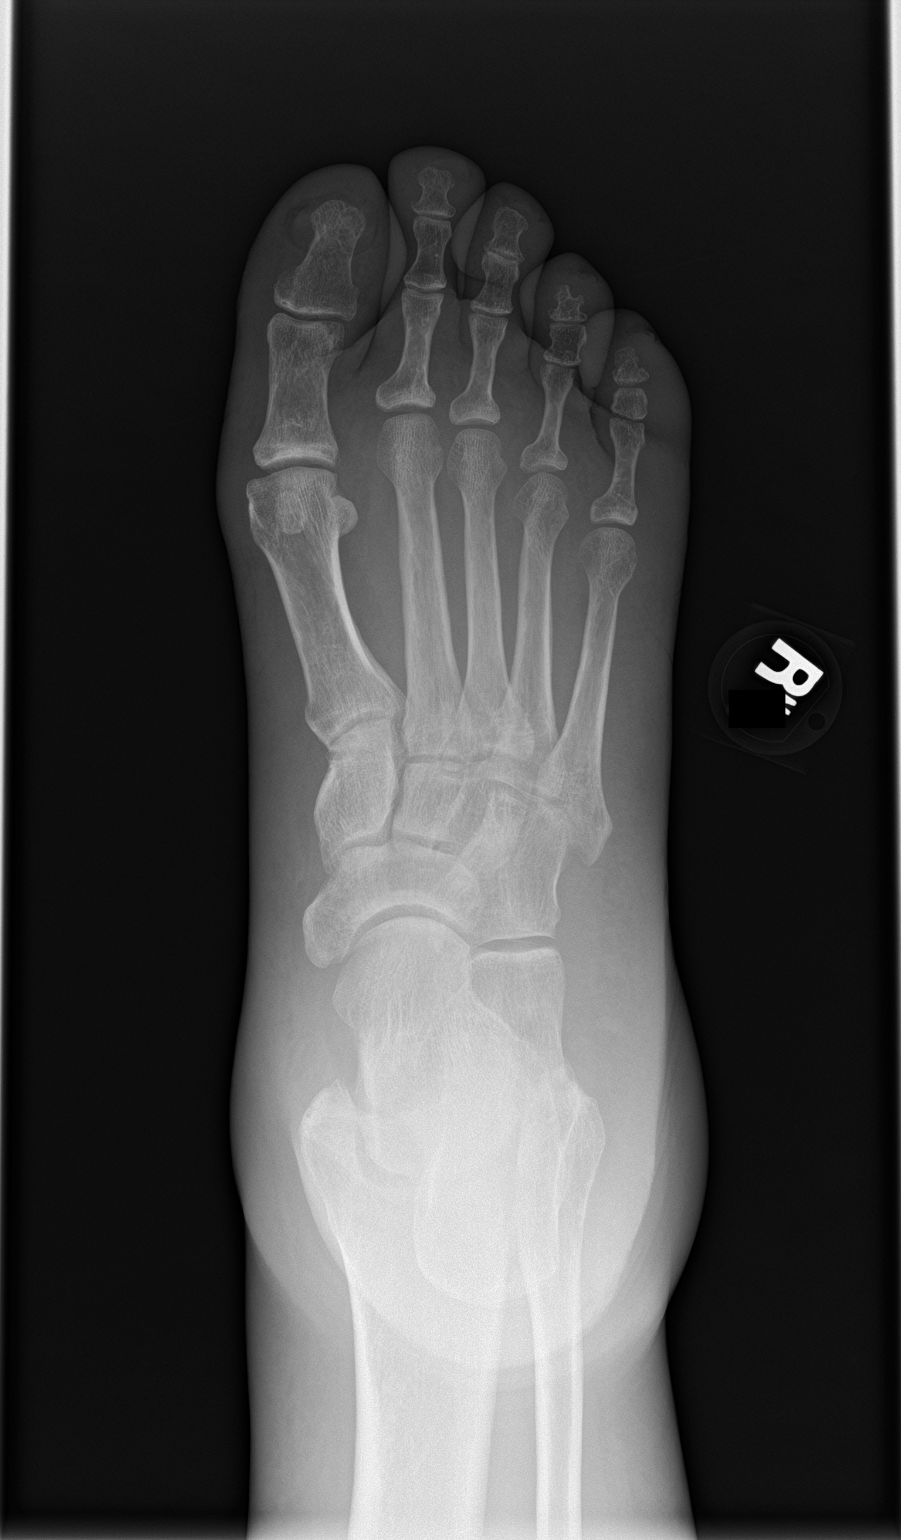
[im 2/3]
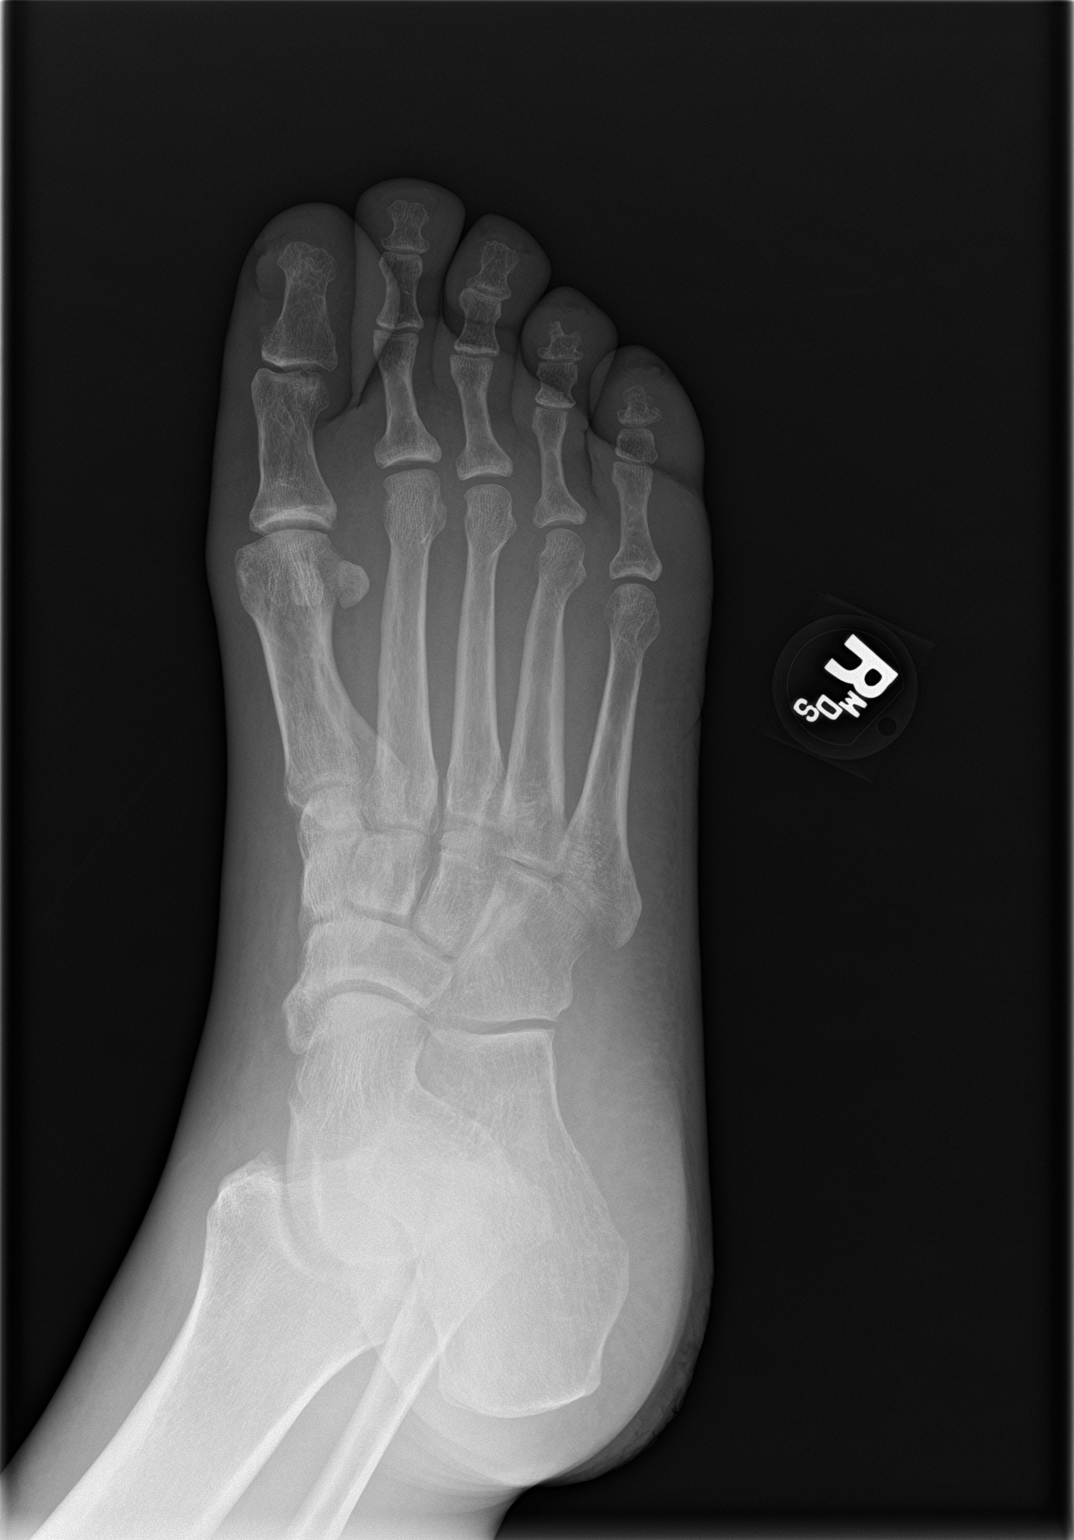
[im 3/3]
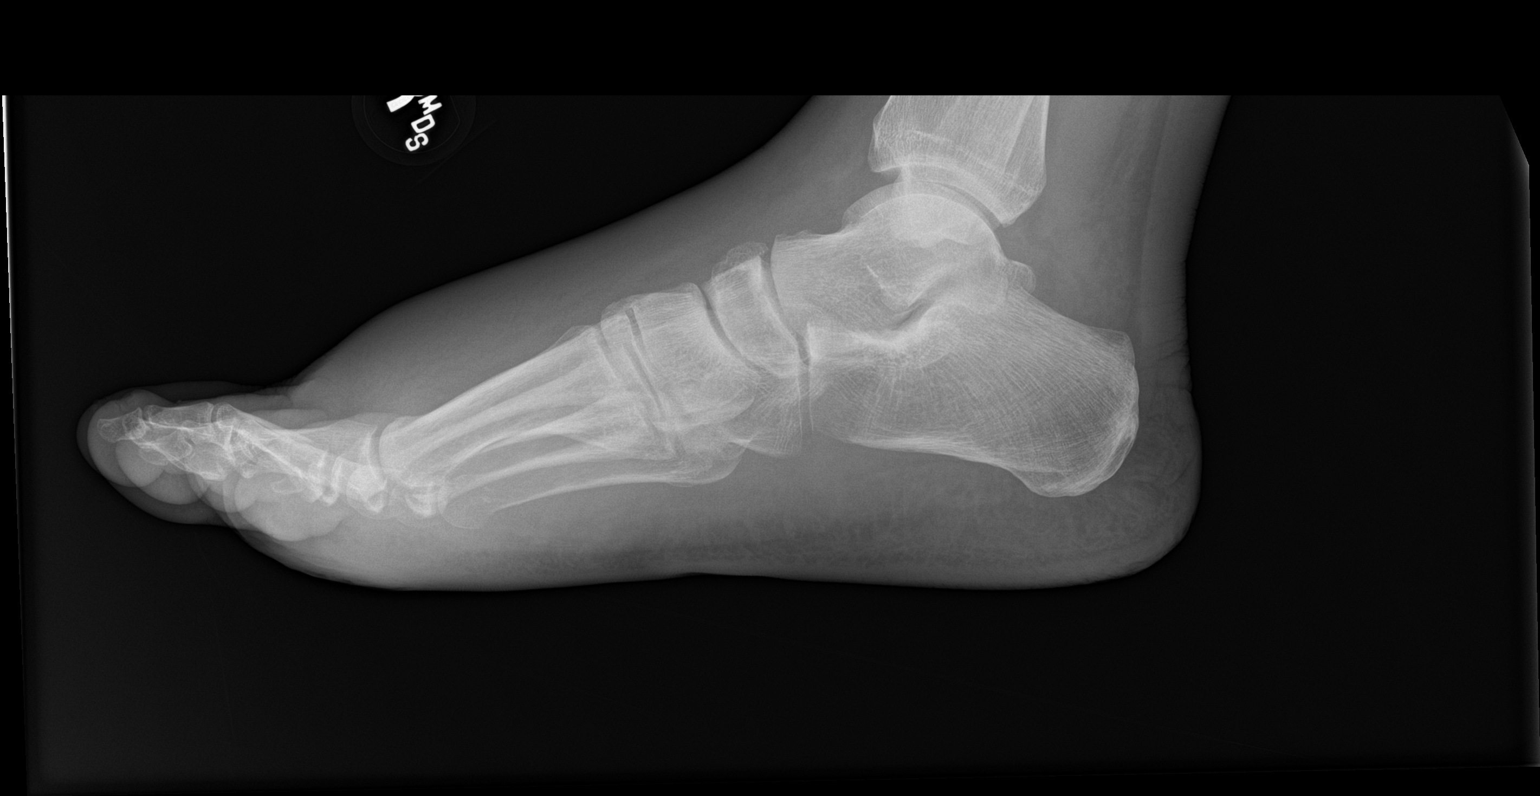

[3 of 3 positions shown; findings below may reference images not displayed]

FINDINGS: There is no evidence of fracture or dislocation. Mild midfoot
osteoarthritis is seen. Diffuse dorsal soft tissue swelling is seen.
IMPRESSION: No acute osseous abnormality.  Diffuse dorsal soft tissue swelling.

## 2021-05-23 DEATH — deceased
